# Patient Record
Sex: Female | Born: 1990 | Race: Black or African American | Hispanic: No | Marital: Single | Smoking: Never smoker
Health system: Southern US, Community
[De-identification: ages and names within clinical notes are randomized; demographics above are authoritative.]

---

## 2014-02-28 ENCOUNTER — Ambulatory Visit (INDEPENDENT_AMBULATORY_CARE_PROVIDER_SITE_OTHER): Payer: 59 | Admitting: Physician Assistant

## 2014-02-28 DIAGNOSIS — S51812A Laceration without foreign body of left forearm, initial encounter: Secondary | ICD-10-CM

## 2014-02-28 DIAGNOSIS — IMO0002 Reserved for concepts with insufficient information to code with codable children: Secondary | ICD-10-CM

## 2014-02-28 DIAGNOSIS — M79602 Pain in left arm: Secondary | ICD-10-CM

## 2014-02-28 NOTE — Patient Instructions (Signed)
Please return in 10 days for follow up.   Laceration Care, Adult A laceration is a cut or lesion that goes through all layers of the skin and into the tissue just beneath the skin. TREATMENT  Some lacerations may not require closure. Some lacerations may not be able to be closed due to an increased risk of infection. It is important to see your caregiver as soon as possible after an injury to minimize the risk of infection and maximize the opportunity for successful closure. If closure is appropriate, pain medicines may be given, if needed. The wound will be cleaned to help prevent infection. Your caregiver will use stitches (sutures), staples, wound glue (adhesive), or skin adhesive strips to repair the laceration. These tools bring the skin edges together to allow for faster healing and a better cosmetic outcome. However, all wounds will heal with a scar. Once the wound has healed, scarring can be minimized by covering the wound with sunscreen during the day for 1 full year. HOME CARE INSTRUCTIONS  For sutures or staples:  Keep the wound clean and dry.  If you were given a bandage (dressing), you should change it at least once a day. Also, change the dressing if it becomes wet or dirty, or as directed by your caregiver.  Wash the wound with soap and water 2 times a day. Rinse the wound off with water to remove all soap. Pat the wound dry with a clean towel.  After cleaning, apply a thin layer of the antibiotic ointment as recommended by your caregiver. This will help prevent infection and keep the dressing from sticking.  You may shower as usual after the first 24 hours. Do not soak the wound in water until the sutures are removed.  Only take over-the-counter or prescription medicines for pain, discomfort, or fever as directed by your caregiver.  Get your sutures or staples removed as directed by your caregiver. For skin adhesive strips:  Keep the wound clean and dry.  Do not get the  skin adhesive strips wet. You may bathe carefully, using caution to keep the wound dry.  If the wound gets wet, pat it dry with a clean towel.  Skin adhesive strips will fall off on their own. You may trim the strips as the wound heals. Do not remove skin adhesive strips that are still stuck to the wound. They will fall off in time. For wound adhesive:  You may briefly wet your wound in the shower or bath. Do not soak or scrub the wound. Do not swim. Avoid periods of heavy perspiration until the skin adhesive has fallen off on its own. After showering or bathing, gently pat the wound dry with a clean towel.  Do not apply liquid medicine, cream medicine, or ointment medicine to your wound while the skin adhesive is in place. This may loosen the film before your wound is healed.  If a dressing is placed over the wound, be careful not to apply tape directly over the skin adhesive. This may cause the adhesive to be pulled off before the wound is healed.  Avoid prolonged exposure to sunlight or tanning lamps while the skin adhesive is in place. Exposure to ultraviolet light in the first year will darken the scar.  The skin adhesive will usually remain in place for 5 to 10 days, then naturally fall off the skin. Do not pick at the adhesive film. You may need a tetanus shot if:  You cannot remember when you had your  last tetanus shot.  You have never had a tetanus shot. If you get a tetanus shot, your arm may swell, get red, and feel warm to the touch. This is common and not a problem. If you need a tetanus shot and you choose not to have one, there is a rare chance of getting tetanus. Sickness from tetanus can be serious. SEEK MEDICAL CARE IF:   You have redness, swelling, or increasing pain in the wound.  You see a red line that goes away from the wound.  You have yellowish-white fluid (pus) coming from the wound.  You have a fever.  You notice a bad smell coming from the wound or  dressing.  Your wound breaks open before or after sutures have been removed.  You notice something coming out of the wound such as wood or glass.  Your wound is on your hand or foot and you cannot move a finger or toe. SEEK IMMEDIATE MEDICAL CARE IF:   Your pain is not controlled with prescribed medicine.  You have severe swelling around the wound causing pain and numbness or a change in color in your arm, hand, leg, or foot.  Your wound splits open and starts bleeding.  You have worsening numbness, weakness, or loss of function of any joint around or beyond the wound.  You develop painful lumps near the wound or on the skin anywhere on your body. MAKE SURE YOU:   Understand these instructions.  Will watch your condition.  Will get help right away if you are not doing well or get worse. Document Released: 01/13/2005 Document Revised: 04/07/2011 Document Reviewed: 07/09/2010 [facility] Patient Information 2015 [place], [place]. This information is not intended to replace advice given to you by your health care provider. Make sure you discuss any questions you have with your health care provider.

## 2014-02-28 NOTE — Progress Notes (Signed)
   Subjective:    Patient ID: Brianna Haney, female    DOB: 01-07-1991, 24 y.o.   MRN: [id number]   HPI  [age] yof with no pertinent pmh presents with laceration injury to left forearm.   Was washing dishes at home 2-3 hrs ago and sustained cut across arm from broken plate. No hx dm, immunosuppression. Had tdap vaccine 3 yrs ago. Denies fever, chills.   Review of Systems No cp, sob.     Objective:   Physical Exam  Constitutional: She is oriented to person, place, and time. She appears well-developed and well-nourished.  Non-toxic appearance. She does not have a sickly appearance. She does not appear ill. No distress.  Cardiovascular:  Pulses:      Radial pulses are 2+ on the left side.  Normal cap refill LUE.   Musculoskeletal:       Left hand: She exhibits normal range of motion. Normal sensation noted. Normal strength noted.  Neurological: She is alert and oriented to person, place, and time.  Strength, sensation intact LUE.   Skin:     3cm x 0.5 cm wound horizontally across ventral left forearm.       Assessment & Plan:   [age] yof with no pertinent pmh presents with laceration injury to left forearm.   Laceration --laceration 2-3 hrs prior to arrival to left forearm --neurovascular intact --sutures, rtc 9 days --wound care instructions given --no indication for abx, tdap utd  Donnajean Lopesodd M. Giamarie Bueche, PA-C Physician Assistant-Certified [facility]  03/01/2014 2:57 PM

## 2014-03-01 ENCOUNTER — Encounter: Payer: Self-pay | Admitting: Physician Assistant

## 2014-03-01 NOTE — Progress Notes (Signed)
Verbal consent obtained from patient.  Local anesthesia with 6cc 2% lido with epi.  Wound scrubbed with soap and water and rinsed.  Wound closed with #6 5-0 ethilon horizontal mattress sutures and #1 5-0 prolene simple interrupted suture.  Wound cleansed and dressed.           Donnajean Lopesodd M. Makensie Mulhall, PA-C Physician Assistant-Certified [facility]  03/01/2014 3:01 PM

## 2014-03-09 ENCOUNTER — Ambulatory Visit (INDEPENDENT_AMBULATORY_CARE_PROVIDER_SITE_OTHER): Payer: 59 | Admitting: Physician Assistant

## 2014-03-09 VITALS — Temp 98.6°F

## 2014-03-09 DIAGNOSIS — IMO0002 Reserved for concepts with insufficient information to code with codable children: Secondary | ICD-10-CM

## 2014-03-09 DIAGNOSIS — Z5189 Encounter for other specified aftercare: Secondary | ICD-10-CM

## 2014-03-09 NOTE — Progress Notes (Signed)
   Subjective:    Patient ID: Brianna Haney, female    DOB: 1990/03/16, 24 y.o.   MRN: [id number]  Chief Complaint  Patient presents with  . Laceration    #6 ethilon, #1 prolene left forearm    HPI  23 yof returns for wound care f/u. Was seen 7 days ago after sustaining laceration to right forearm from kitchen plate. Had 6 horizontal mattress sutures placed and one simple interrupted.   Review of Systems No fever, chills.     Objective:   Physical Exam  Constitutional: She is oriented to person, place, and time. She appears well-developed and well-nourished.  Non-toxic appearance. She does not have a sickly appearance. She does not appear ill. No distress.  Neurological: She is alert and oriented to person, place, and time.  Skin:  Wound well healed and approximated. Wound with inflammatory reaction around each suture. No purulence. No induration. No ttp.       Assessment & Plan:   [age] yof returns for wound care f/u  Laceration Encounter for wound care --sutures removed, no sign infx --large inflammatory response to sutures which were only in place 7 days --pt instructed that she is fast healer and may benefit from faster suture removal if needed in the future --rec cocoa butter rubbing over wound to help minimize scarring and speed up healing  Donnajean Lopesodd M. Taiki Buckwalter, PA-C Physician Assistant-Certified [facility]  03/09/2014 9:50 PM

## 2014-05-11 ENCOUNTER — Ambulatory Visit (INDEPENDENT_AMBULATORY_CARE_PROVIDER_SITE_OTHER): Payer: 59 | Admitting: Physician Assistant

## 2014-05-11 VITALS — BP 108/54 | HR 78 | Temp 97.7°F | Resp 17 | Ht 69.0 in | Wt 157.4 lb

## 2014-05-11 DIAGNOSIS — Z3200 Encounter for pregnancy test, result unknown: Secondary | ICD-10-CM

## 2014-05-11 DIAGNOSIS — J069 Acute upper respiratory infection, unspecified: Secondary | ICD-10-CM | POA: Diagnosis not present

## 2014-05-11 DIAGNOSIS — H109 Unspecified conjunctivitis: Secondary | ICD-10-CM

## 2014-05-11 DIAGNOSIS — B9789 Other viral agents as the cause of diseases classified elsewhere: Secondary | ICD-10-CM

## 2014-05-11 DIAGNOSIS — Z3009 Encounter for other general counseling and advice on contraception: Secondary | ICD-10-CM | POA: Diagnosis not present

## 2014-05-11 LAB — POCT URINE PREGNANCY: PREG TEST UR: NEGATIVE

## 2014-05-11 MED ORDER — HYDROCODONE-HOMATROPINE 5-1.5 MG/5ML PO SYRP: 5.0000 mL | ORAL_SOLUTION | Freq: Three times a day (TID) | ORAL | Status: DC | PRN

## 2014-05-11 MED ORDER — IBUPROFEN 600 MG PO TABS: 600.0000 mg | ORAL_TABLET | Freq: Four times a day (QID) | ORAL | Status: DC | PRN

## 2014-05-11 MED ORDER — POLYMYXIN B-TRIMETHOPRIM 10000-0.1 UNIT/ML-% OP SOLN: 1.0000 [drp] | OPHTHALMIC | Status: DC

## 2014-05-11 MED ORDER — CETIRIZINE-PSEUDOEPHEDRINE ER 5-120 MG PO TB12: 1.0000 | ORAL_TABLET | ORAL | Status: AC

## 2014-05-11 MED ORDER — NORGESTIM-ETH ESTRAD TRIPHASIC 0.18/0.215/0.25 MG-25 MCG PO TABS: 1.0000 | ORAL_TABLET | Freq: Every day | ORAL | Status: DC

## 2014-05-11 NOTE — Progress Notes (Signed)
05/11/2014 at 10:01 PM  Brianna Haney / DOB: 07-12-1990 / MRN: [id number]  The patient  does not have a problem list on file.  SUBJECTIVE  Chief complaint: Headache; Nasal Congestion; Eye Drainage; Fatigue; Generalized Body Aches; and Possible Pregnancy   History of present illness: Brianna Haney is 24 y.o. well appearing female presenting for a fatigue, headache, and body aches which she states is mild at this time. This started 6 days ago and is improving.  She denies fever, SOB, DOE, and cough.She has tried OTC cold preparations with fair relief.   She also complains of stable moderate right eye discharge that started 1 day ago. Associated symptoms include mild eye irritation and she denies changes in vision and eye pain. She has tried nothing for her symptoms.  She thinks she may be pregnant and would like a pregnancy test today.  She has taken birth control in the past but was told to have this medication refilled at the age of 24 that she would need to get a pap test first.  She would like to have birth control but says that she is "not ready for that yet."  She has had Gardasil in the past.    She  has no past medical history on file.    She has a current medication list which includes the following prescription(s): cetirizine-pseudoephedrine, hydrocodone-homatropine, ibuprofen, norgestimate-ethinyl estradiol triphasic, and trimethoprim-polymyxin b.  Brianna Haney has No Known Allergies. She  reports that she has never smoked. She does not have any smokeless tobacco history on file. She reports that she does not drink alcohol or use illicit drugs. She  has no sexual activity history on file. The patient  has no past surgical history on file.  Her family history is not on file.  Review of Systems  Constitutional: Negative for fever.  Eyes: Negative for blurred vision, pain and discharge.  Respiratory: Negative for cough.   Cardiovascular: Negative for chest pain and palpitations.    Gastrointestinal: Negative for heartburn, nausea and vomiting.  Genitourinary: Negative.   Musculoskeletal: Negative for neck pain.  Skin: Negative for rash.  Neurological: Negative for dizziness and headaches.    OBJECTIVE  Her  height is 5\' 9"  (1.753 m) and weight is 157 lb 6.4 oz (71.396 kg). Her oral temperature is 97.7 F (36.5 C). Her blood pressure is 108/54 and her pulse is 78. Her respiration is 17 and oxygen saturation is 100%.  The patient's body mass index is 23.23 kg/(m^2).  Physical Exam  Constitutional: She is oriented to person, place, and time. She appears well-developed and well-nourished. No distress.  HENT:  Right Ear: Hearing, tympanic membrane, external ear and ear canal normal.  Left Ear: Hearing, tympanic membrane, external ear and ear canal normal.  Nose: Mucosal edema present. Right sinus exhibits no maxillary sinus tenderness and no frontal sinus tenderness. Left sinus exhibits no maxillary sinus tenderness and no frontal sinus tenderness.  Mouth/Throat: Uvula is midline, oropharynx is clear and moist and mucous membranes are normal.  Eyes:    Cardiovascular: Normal rate, regular rhythm and normal heart sounds.   Respiratory: Effort normal and breath sounds normal. She has no wheezes. She has no rales.  GI: Soft. She exhibits no distension.  Neurological: She is alert and oriented to person, place, and time.  Skin: Skin is warm and dry. She is not diaphoretic.  Psychiatric: She has a normal mood and affect.    Results for orders placed or performed in visit  on 05/11/14 (from the past 24 hour(s))  POCT urine pregnancy     Status: None   Collection Time: 05/11/14  4:01 PM  Result Value Ref Range   Preg Test, Ur Negative     ASSESSMENT & PLAN  [name] was seen today for headache, nasal congestion, eye drainage, fatigue, generalized body aches and possible pregnancy.  Diagnoses and all orders for this visit:  Possible pregnancy Orders: -     POCT  urine pregnancy  Conjunctivitis of right eye: Most likely viral.  Advised patient treat with warm compress and fill antibiotic only if her eye does not improve, or gets worse, over the next 2 days.  Orders: -     trimethoprim-polymyxin b (POLYTRIM) ophthalmic solution; Place 1 drop into both eyes every 4 (four) hours.  Viral URI with cough: It is possible that the patient had the flu and is now recovering.  Did not test as Tamiflu would most likely not help. Will treat symtpomatically.  Orders: -     ibuprofen (ADVIL,MOTRIN) 600 MG tablet; Take 1 tablet (600 mg total) by mouth every 6 (six) hours as needed. -     cetirizine-pseudoephedrine (ZYRTEC-D ALLERGY & CONGESTION) 5-120 MG per tablet; Take 1 tablet by mouth every morning. Do not use OTC cold remedies with this medication. -     HYDROcodone-homatropine (HYCODAN) 5-1.5 MG/5ML syrup; Take 5 mLs by mouth every 8 (eight) hours as needed for cough.  Encounter for other general counseling or advice on contraception: Patient would like to [facility] at [facility] and will call to do so.  She does need a pap, but should have contraception until that time if she desires.  Orders: -     Norgestimate-Ethinyl Estradiol Triphasic (ORTHO TRI-CYCLEN LO) 0.18/0.215/0.25 MG-25 MCG tab; Take 1 tablet by mouth daily.    The patient was advised to call or come back to clinic if she does not see an improvement in symptoms, or worsens with the above plan.   Deliah Boston, MHS, PA-C [facility] 05/11/2014 10:01 PM

## 2015-09-06 ENCOUNTER — Encounter: Payer: Self-pay | Admitting: Internal Medicine

## 2015-09-06 ENCOUNTER — Ambulatory Visit (INDEPENDENT_AMBULATORY_CARE_PROVIDER_SITE_OTHER): Payer: 59 | Admitting: Internal Medicine

## 2015-09-06 VITALS — BP 106/66 | HR 63 | Temp 98.6°F | Ht 68.0 in | Wt 157.8 lb

## 2015-09-06 DIAGNOSIS — Z3009 Encounter for other general counseling and advice on contraception: Secondary | ICD-10-CM | POA: Diagnosis not present

## 2015-09-06 DIAGNOSIS — Z7189 Other specified counseling: Secondary | ICD-10-CM

## 2015-09-06 DIAGNOSIS — Z7689 Persons encountering health services in other specified circumstances: Secondary | ICD-10-CM

## 2015-09-06 MED ORDER — NORGESTIM-ETH ESTRAD TRIPHASIC 0.18/0.215/0.25 MG-25 MCG PO TABS: 1.0000 | ORAL_TABLET | Freq: Every day | ORAL | 11 refills | Status: AC

## 2015-09-06 NOTE — Patient Instructions (Signed)

## 2015-09-06 NOTE — Progress Notes (Signed)
HPI  Pt presents to the clinic today to establish care. She has not had a PCP in many years.  Flu: never Tetanus: 09/2011 Pap Smear: never Dentist: as needed  She would like to restart her OCP. She was on Ortho-Tri-Cyclen in the past and reports it worked well for her. She is not sexually active at this time. She is currently on her menstrual cycle.  History reviewed. No pertinent past medical history.  Current Outpatient Prescriptions  Medication Sig Dispense Refill  . ALOE VERA JUICE PO Take 1 capsule by mouth daily.    . Multiple Vitamin (MULTIVITAMIN) tablet Take 1 tablet by mouth daily.    Marland Kitchen. OVER THE COUNTER MEDICATION Take 1 capsule by mouth 2 (two) times daily. Burdock root    . OVER THE COUNTER MEDICATION Take 1 capsule by mouth 2 (two) times daily. Neem oil    . Norgestimate-Ethinyl Estradiol Triphasic (ORTHO TRI-CYCLEN LO) 0.18/0.215/0.25 MG-25 MCG tab Take 1 tablet by mouth daily. (Patient not taking: Reported on 09/06/2015) 1 Package 11   No current facility-administered medications for this visit.     No Known Allergies  Family History  Problem Relation Age of Onset  . Breast cancer Maternal Grandmother   . Heart disease Maternal Grandmother   . Hypertension Maternal Grandmother     Social History   Social History  . Marital status: Single    Spouse name: N/A  . Number of children: N/A  . Years of education: N/A   Occupational History  . Not on file.   Social History Main Topics  . Smoking status: Never Smoker  . Smokeless tobacco: Not on file  . Alcohol use 0.0 oz/week     Comment: occasional  . Drug use: No  . Sexual activity: Not on file   Other Topics Concern  . Not on file   Social History Narrative  . No narrative on file    ROS:  Constitutional: Denies fever, malaise, fatigue, headache or abrupt weight changes.  Respiratory: Denies difficulty breathing, shortness of breath, cough or sputum production.   Cardiovascular: Denies chest  pain, chest tightness, palpitations or swelling in the hands or feet.  Gastrointestinal: Denies abdominal pain, bloating, constipation, diarrhea or blood in the stool.  GU: Denies frequency, urgency, pain with urination, blood in urine, odor or discharge. Neurological: Denies dizziness, difficulty with memory, difficulty with speech or problems with balance and coordination.  Psych: Denies anxiety, depression, SI/HI.  No other specific complaints in a complete review of systems (except as listed in HPI above).  PE:  BP 106/66   Pulse 63   Temp 98.6 F (37 C) (Oral)   Ht 5\' 8"  (1.727 m)   Wt 157 lb 12 oz (71.6 kg)   LMP 09/06/2015 (Exact Date)   SpO2 99%   BMI 23.99 kg/m  Wt Readings from Last 3 Encounters:  09/06/15 157 lb 12 oz (71.6 kg)  05/11/14 157 lb 6.4 oz (71.4 kg)    General: Appears her stated age, well developed, well nourished in NAD. Skin: Dry and intact. Cardiovascular: Normal rate and rhythm. S1,S2 noted.  No murmur, rubs or gallops noted.  Pulmonary/Chest: Normal effort and positive vesicular breath sounds. No respiratory distress. No wheezes, rales or ronchi noted.  Neurological: Alert and oriented.  Psychiatric: Mood and affect normal. Behavior is normal. Judgment and thought content normal.     Assessment and Plan:  Contraceptive counseling:  She will make an appt for her pap smear Ortho-Tri-Cyclen refilled today  She will start this Sunday because she in on her menstrual cycle Discussed using a back up method in the first 2 weeks on OCP's  Make an appt for you annual exam Nicki Reaper, NP

## 2015-09-06 NOTE — Progress Notes (Signed)
Pre visit review using our clinic review tool, if applicable. No additional management support is needed unless otherwise documented below in the visit note.   Pt has not had previous PCP...Marland Kitchen.Marland Kitchen.Flu--never... TD--09/2011...Marland Kitchen.HPV--completed 3 doses.Marland Kitchen.Marland Kitchen.Marland Kitchen.Mening--10/21/2005---2013--started college.Marland Kitchen.Marland Kitchen.Marland Kitchen.Pap--never... Vision--PRN... Dentist--PRN.Marland Kitchen..Marland Kitchen

## 2016-01-29 ENCOUNTER — Other Ambulatory Visit: Payer: Self-pay | Admitting: Nurse Practitioner

## 2016-01-29 ENCOUNTER — Other Ambulatory Visit (HOSPITAL_COMMUNITY)
Admission: RE | Admit: 2016-01-29 | Discharge: 2016-01-29 | Disposition: A | Discharge status: Home / Self Care | Payer: 59 | Source: Ambulatory Visit | Attending: Nurse Practitioner | Admitting: Nurse Practitioner

## 2016-01-29 DIAGNOSIS — Z113 Encounter for screening for infections with a predominantly sexual mode of transmission: Secondary | ICD-10-CM | POA: Insufficient documentation

## 2016-01-29 DIAGNOSIS — Z01419 Encounter for gynecological examination (general) (routine) without abnormal findings: Secondary | ICD-10-CM | POA: Insufficient documentation

## 2016-01-31 LAB — CYTOLOGY - PAP
CHLAMYDIA, DNA PROBE: NEGATIVE
Diagnosis: NEGATIVE
Neisseria Gonorrhea: NEGATIVE
TRICH (WINDOWPATH): NEGATIVE

## 2017-12-28 ENCOUNTER — Other Ambulatory Visit: Payer: Self-pay | Admitting: Family

## 2017-12-28 ENCOUNTER — Ambulatory Visit
Admission: RE | Admit: 2017-12-28 | Discharge: 2017-12-28 | Disposition: A | Discharge status: Home / Self Care | Payer: 59 | Source: Ambulatory Visit | Attending: Family | Admitting: Family

## 2017-12-28 DIAGNOSIS — R0602 Shortness of breath: Secondary | ICD-10-CM

## 2017-12-28 DIAGNOSIS — R059 Cough, unspecified: Secondary | ICD-10-CM

## 2017-12-28 DIAGNOSIS — R05 Cough: Secondary | ICD-10-CM

## 2017-12-28 DIAGNOSIS — R509 Fever, unspecified: Secondary | ICD-10-CM

## 2019-12-10 IMAGING — DX DG CHEST 2V
2 series · 2 of 2 positions shown · non-contrast
Comparison: None.

CLINICAL DATA: Cough, shortness of breath, chest pain, fever.

EXAM:
CHEST - 2 VIEW

[dg chest 2 view (1 of 2)]
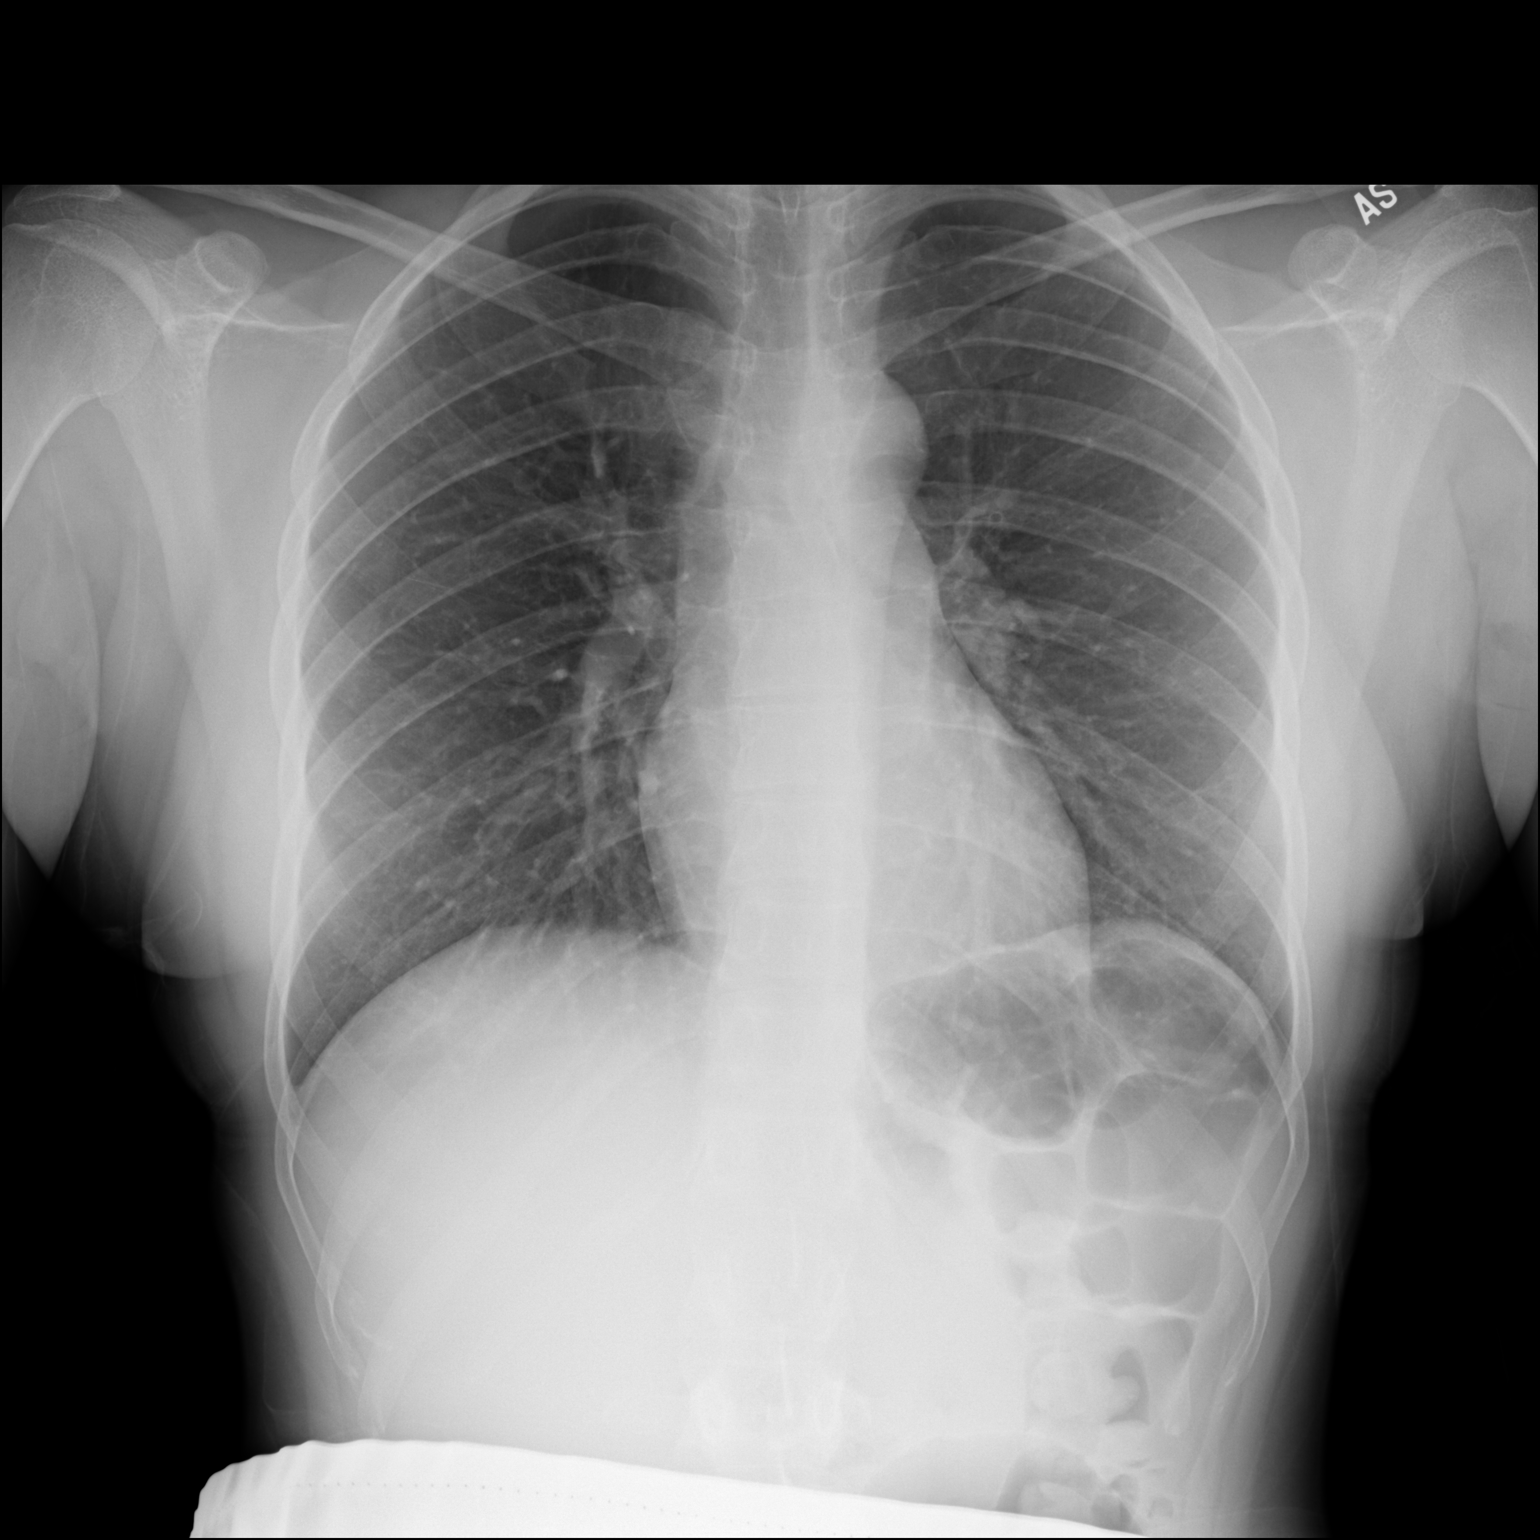

[dg chest 2 view (2 of 2)]
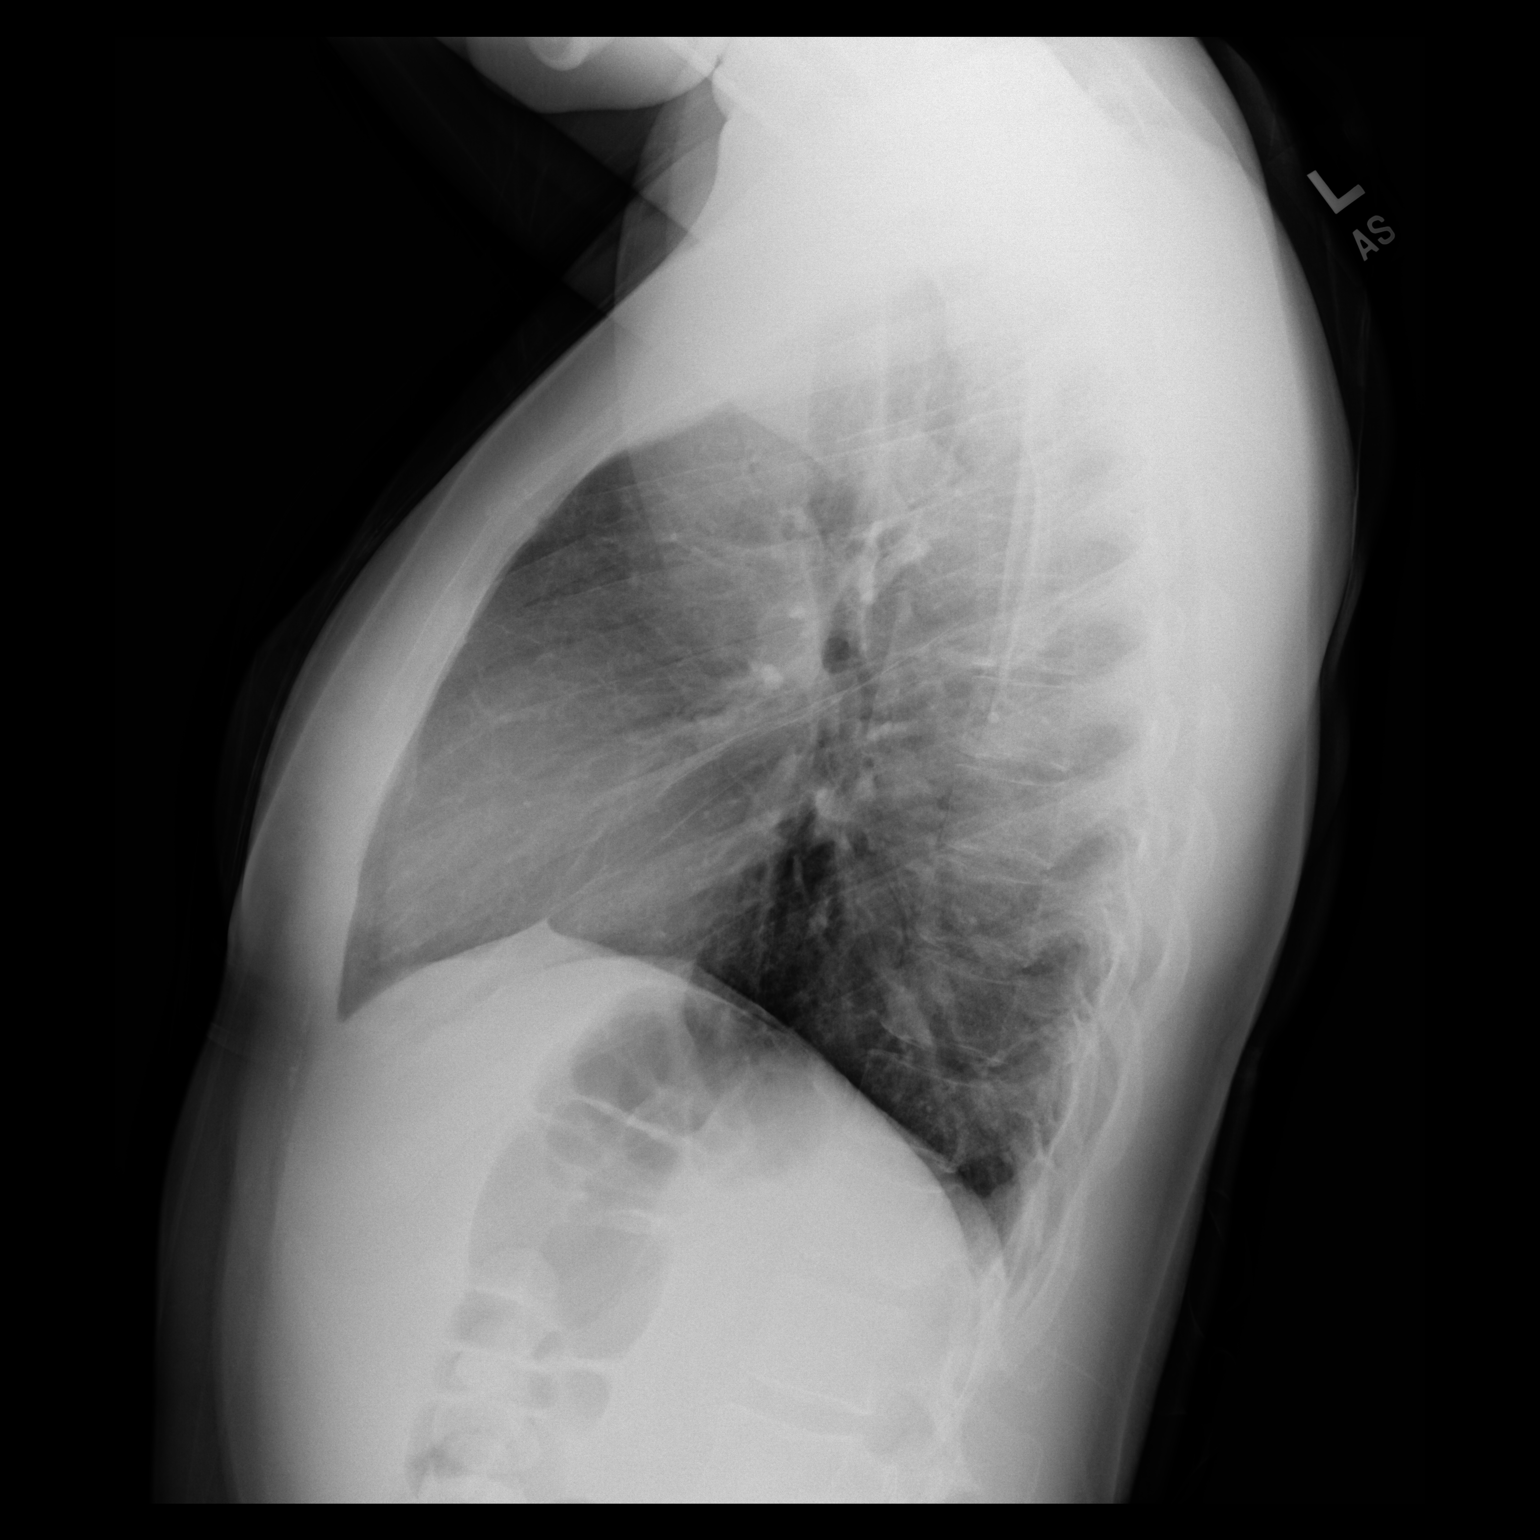

[2 of 2 positions shown; findings below may reference images not displayed]

FINDINGS: The heart size and mediastinal contours are within normal limits.
Both lungs are clear. The visualized skeletal structures are
unremarkable.
IMPRESSION: No active cardiopulmonary disease.  No evidence of pneumonia.

## 2020-07-19 ENCOUNTER — Ambulatory Visit (INDEPENDENT_AMBULATORY_CARE_PROVIDER_SITE_OTHER): Payer: 59 | Admitting: Podiatry

## 2020-07-19 ENCOUNTER — Ambulatory Visit: Payer: 59

## 2020-07-19 ENCOUNTER — Other Ambulatory Visit: Payer: Self-pay

## 2020-07-19 DIAGNOSIS — M79674 Pain in right toe(s): Secondary | ICD-10-CM

## 2020-07-19 DIAGNOSIS — M79675 Pain in left toe(s): Secondary | ICD-10-CM

## 2020-07-19 DIAGNOSIS — M2012 Hallux valgus (acquired), left foot: Secondary | ICD-10-CM | POA: Diagnosis not present

## 2020-07-19 DIAGNOSIS — M2141 Flat foot [pes planus] (acquired), right foot: Secondary | ICD-10-CM

## 2020-07-19 DIAGNOSIS — M2011 Hallux valgus (acquired), right foot: Secondary | ICD-10-CM | POA: Diagnosis not present

## 2020-07-19 DIAGNOSIS — M21611 Bunion of right foot: Secondary | ICD-10-CM

## 2020-07-19 DIAGNOSIS — M2142 Flat foot [pes planus] (acquired), left foot: Secondary | ICD-10-CM

## 2020-07-19 DIAGNOSIS — M21612 Bunion of left foot: Secondary | ICD-10-CM

## 2020-07-19 NOTE — Patient Instructions (Signed)

## 2020-07-23 ENCOUNTER — Encounter: Payer: Self-pay | Admitting: Podiatry

## 2020-07-23 NOTE — Progress Notes (Signed)
  Subjective:  Patient ID: Brianna Haney, female    DOB: Apr 08, 1990,  MRN: [id number]  Chief Complaint  Patient presents with   Toe Pain    Pt states she has bilateral medial side pain that comes and goes. Pt states she feels like her toe has to be cracked all the time.       30 y.o. female presents with the above complaint. History confirmed with patient.   Objective:  Physical Exam: warm, good capillary refill, no trophic changes or ulcerative lesions, normal DP and PT pulses, and normal sensory exam.  She has mild hallux valgus and pes planus deformity.  She does have some popping of the joint with range of motion of the left hallux, range of motion is full and smooth and pain-free overall.  Mild pain in the plantar fascia  Radiographs: Multiple views x-ray of both feet: Mild hallux valgus deformity and pes planus bilateral Assessment:   1. Toe pain, bilateral   2. Hallux valgus with bunions, left   3. Hallux valgus with bunions, right   4. Pes planus of both feet      Plan:  Patient was evaluated and treated and all questions answered.  We discussed treatment options including surgical and nonsurgical treatment for bunions as well as the mild plantar fasciitis that she has.  Recommend stretching exercises for the plantar fasciitis and heel pain.  Discussed using bunion splints to straighten the toe and hold in a better position to maybe keep the joint from cracking.  She will return as needed if she is interested in surgical correction of the bunions.  Return if symptoms worsen or fail to improve.
